# Patient Record
Sex: Male | Born: 1980 | Race: Black or African American | Hispanic: No | Marital: Married | Smoking: Current every day smoker
Health system: Southern US, Community
[De-identification: ages and names within clinical notes are randomized; demographics above are authoritative.]

---

## 2017-03-28 ENCOUNTER — Emergency Department (HOSPITAL_COMMUNITY): Payer: Self-pay

## 2017-03-28 ENCOUNTER — Encounter (HOSPITAL_COMMUNITY): Payer: Self-pay | Admitting: Emergency Medicine

## 2017-03-28 ENCOUNTER — Other Ambulatory Visit: Payer: Self-pay

## 2017-03-28 ENCOUNTER — Emergency Department (HOSPITAL_COMMUNITY)
Admission: EM | Admit: 2017-03-28 | Discharge: 2017-03-28 | Disposition: A | Discharge status: Home / Self Care | Payer: Self-pay | Attending: Emergency Medicine | Admitting: Emergency Medicine

## 2017-03-28 DIAGNOSIS — R05 Cough: Secondary | ICD-10-CM

## 2017-03-28 DIAGNOSIS — F17210 Nicotine dependence, cigarettes, uncomplicated: Secondary | ICD-10-CM | POA: Insufficient documentation

## 2017-03-28 DIAGNOSIS — J069 Acute upper respiratory infection, unspecified: Secondary | ICD-10-CM | POA: Insufficient documentation

## 2017-03-28 DIAGNOSIS — R059 Cough, unspecified: Secondary | ICD-10-CM

## 2017-03-28 DIAGNOSIS — B9789 Other viral agents as the cause of diseases classified elsewhere: Secondary | ICD-10-CM | POA: Insufficient documentation

## 2017-03-28 MED ORDER — BENZONATATE 100 MG PO CAPS: 100.0000 mg | ORAL_CAPSULE | Freq: Three times a day (TID) | ORAL | 0 refills | Status: AC | PRN

## 2017-03-28 MED ORDER — ALBUTEROL SULFATE HFA 108 (90 BASE) MCG/ACT IN AERS
2.0000 | INHALATION_SPRAY | RESPIRATORY_TRACT | Status: DC | PRN
  Administered 2017-03-28: 2 via RESPIRATORY_TRACT
  Filled 2017-03-28: qty 6.7

## 2017-03-28 NOTE — ED Triage Notes (Signed)
Pt c/o cough for several days with throat pain and irration. Pt also reports chest tightness and HA with coughing spells. Pt feels he did have a fever at home, +chills, denies n/v/d.

## 2017-03-28 NOTE — Discharge Instructions (Signed)
It was my pleasure taking care of you today!   Your symptoms are likely due to a viral upper respiratory infection. Fortunately, we did not see evidence of serious infection and can treat your symptoms. Flonase and mucinex for nasal congestion, tessalon as needed for cough. Alternate between Tylenol or ibuprofen as needed for body aches / fevers.   Rest, drink plenty of fluids to be sure you are staying hydrated.   Please follow up with your primary doctor for discussion of your diagnoses and further evaluation after today's visit if symptoms persist longer than 7 days; Return to the ER for high fevers, difficulty breathing or other concerning symptoms   To find a primary care or specialty doctor please call [phone] or [phone] to access "[facility] Find a Doctor Service."  You may also go on the [facility] website at [email or web address]  There are also multiple [facility] and [facility] practices throughout the [place] that are frequently accepting new patients. You may find a clinic that is close to your home and contact them.  [facility] - [address] [place] [phone]  [facility] in [place] (also locations in [place] and [place]) - [address] [place] [phone]  [facility] - [address] [place] [phone]

## 2017-03-28 NOTE — ED Notes (Signed)
Patient transported to X-ray 

## 2017-03-28 NOTE — ED Provider Notes (Signed)
[facility]-EMERGENCY DEPT Provider Note   CSN: [id number] Arrival date & time: 03/28/17  1911   By signing my name below, I, Jesus Henderson, attest that this documentation has been prepared under the direction and in the presence of [facility], PA-C. Electronically Signed: Freida Henderson, Scribe. 03/28/2017. 8:29 PM.   History   Chief Complaint Chief Complaint  Patient presents with  . Cough    The history is provided by the patient. No language interpreter was used.    HPI Comments:  Jesus Henderson is a 36 y.o. male who presents to the Emergency Department complaining of a frequent dry cough x 2 days. He reports associated tightness in his chest with coughing, nasal congestion, sore throat and generalized pounding HA. Associated wiHe has taken cough syrup and Tylenol with minimal relief No sick contacts at home. Pt is a current smoker.   History reviewed. No pertinent past medical history.  There are no active problems to display for this patient.   History reviewed. No pertinent surgical history.     Home Medications    Prior to Admission medications   Medication Sig Start Date End Date Taking? Authorizing Provider  benzonatate (TESSALON) 100 MG capsule Take 1 capsule (100 mg total) by mouth 3 (three) times daily as needed for cough. 03/28/17   Ward, Chase Picket, PA-C    Family History No family history on file.  Social History Social History  Substance Use Topics  . Smoking status: Current Every Day Smoker    Types: Cigarettes  . Smokeless tobacco: Never Used  . Alcohol use Yes     Comment: occ     Allergies   Patient has no known allergies.   Review of Systems Review of Systems  Constitutional: Negative for fever.  HENT: Positive for congestion and sore throat.   Respiratory: Positive for cough and chest tightness.   Neurological: Positive for headaches.     Physical Exam Updated Vital Signs BP 112/66   Pulse 79   Temp 98.4 F (36.9 C)   Resp 18    Ht 6' (1.829 m)   Wt 68 kg (150 lb)   SpO2 98%   BMI 20.34 kg/m   Physical Exam  Constitutional: He is oriented to person, place, and time. He appears well-developed and well-nourished. No distress.  HENT:  Head: Normocephalic and atraumatic.  OP with erythema, no exudates or tonsillar hypertrophy. + nasal congestion with mucosal edema. No focal areas of sinus tenderness.  Neck: Normal range of motion. Neck supple.  No meningeal signs.   Cardiovascular: Normal rate, regular rhythm and normal heart sounds.   No murmur heard. Pulmonary/Chest: Effort normal and breath sounds normal. No respiratory distress. He has no wheezes. He has no rales.  Lungs are clear to auscultation bilaterally - no w/r/r  Abdominal: Soft. He exhibits no distension. There is no tenderness.  Musculoskeletal: Normal range of motion.  Neurological: He is alert and oriented to person, place, and time.  Skin: Skin is warm and dry. He is not diaphoretic.  Nursing note and vitals reviewed.    ED Treatments / Results  DIAGNOSTIC STUDIES:  Oxygen Saturation is 96% on RA, normal by my interpretation.    COORDINATION OF CARE:  8:12 PM Discussed treatment plan with pt at bedside and pt agreed to plan.  Labs (all labs ordered are listed, but only abnormal results are displayed) Labs Reviewed - No data to display  EKG  EKG Interpretation None  Radiology Dg Chest 2 View  Result Date: 03/28/2017 CLINICAL DATA:  Cough EXAM: CHEST  2 VIEW COMPARISON:  None. FINDINGS: The heart size and mediastinal contours are within normal limits. Both lungs are clear. The visualized skeletal structures are unremarkable. IMPRESSION: No active cardiopulmonary disease. Electronically Signed   By: Deatra RobinsonKevin  Herman M.D.   On: 03/28/2017 21:06    Procedures Procedures (including critical care time)  Medications Ordered in ED Medications  albuterol (PROVENTIL HFA;VENTOLIN HFA) 108 (90 Base) MCG/ACT inhaler 2 puff (2 puffs  Inhalation Given 03/28/17 2110)     Initial Impression / Assessment and Plan / ED Course  I have reviewed the triage vital signs and the nursing notes.  Pertinent labs & imaging results that were available during my care of the patient were reviewed by me and considered in my medical decision making (see chart for details).     Jesus PileKevonta Henderson is a 36 y.o. male who presents to ED for cough, congestion.   On exam, patient is afebrile, non-toxic appearing with a clear lung exam. Mild rhinorrhea and OP with erythema but no exudates or tonsillar hypertrophy.  CXR negative.   Sxs today likely due to viral URI.Symptomatic home care instructions discussed. Rx for tessalon given. PCP follow up strongly encouraged if symptoms persist. Reasons to return to ER discussed. All questions answered.   Blood pressure 112/66, pulse 79, temperature 98.4 F (36.9 C), resp. rate 18, height 6' (1.829 m), weight 68 kg (150 lb), SpO2 98 %.    Final Clinical Impressions(s) / ED Diagnoses   Final diagnoses:  Cough  Viral URI with cough    New Prescriptions New Prescriptions   BENZONATATE (TESSALON) 100 MG CAPSULE    Take 1 capsule (100 mg total) by mouth 3 (three) times daily as needed for cough.   I personally performed the services described in this documentation, which was scribed in my presence. The recorded information has been reviewed and is accurate.     Ward, Chase PicketJaime Pilcher, PA-C 03/28/17 2149    Gerhard MunchLockwood, Robert, MD 03/29/17 1236

## 2021-12-17 ENCOUNTER — Encounter (HOSPITAL_COMMUNITY): Payer: Self-pay

## 2021-12-17 ENCOUNTER — Emergency Department (HOSPITAL_COMMUNITY)
Admission: EM | Admit: 2021-12-17 | Discharge: 2021-12-17 | Disposition: A | Discharge status: Home / Self Care | Payer: Self-pay | Attending: Student | Admitting: Student

## 2021-12-17 ENCOUNTER — Emergency Department (HOSPITAL_COMMUNITY): Payer: Self-pay

## 2021-12-17 DIAGNOSIS — S0990XA Unspecified injury of head, initial encounter: Secondary | ICD-10-CM

## 2021-12-17 DIAGNOSIS — F17210 Nicotine dependence, cigarettes, uncomplicated: Secondary | ICD-10-CM | POA: Insufficient documentation

## 2021-12-17 DIAGNOSIS — S0181XA Laceration without foreign body of other part of head, initial encounter: Secondary | ICD-10-CM | POA: Insufficient documentation

## 2021-12-17 DIAGNOSIS — Z23 Encounter for immunization: Secondary | ICD-10-CM | POA: Insufficient documentation

## 2021-12-17 MED ORDER — TETANUS-DIPHTH-ACELL PERTUSSIS 5-2.5-18.5 LF-MCG/0.5 IM SUSY
0.5000 mL | PREFILLED_SYRINGE | Freq: Once | INTRAMUSCULAR | Status: AC
  Administered 2021-12-17: 0.5 mL via INTRAMUSCULAR
  Filled 2021-12-17: qty 0.5

## 2021-12-17 MED ORDER — LIDOCAINE-EPINEPHRINE (PF) 2 %-1:200000 IJ SOLN
10.0000 mL | Freq: Once | INTRAMUSCULAR | Status: AC
  Administered 2021-12-17: 10 mL
  Filled 2021-12-17: qty 20

## 2021-12-17 MED ORDER — CEPHALEXIN 500 MG PO CAPS: 500.0000 mg | ORAL_CAPSULE | Freq: Four times a day (QID) | ORAL | 0 refills | Status: AC

## 2021-12-17 NOTE — Discharge Instructions (Signed)
Return for any problem. ? ?Your laceration was repaired with sutures that should absorb on their own.  There is no need to return for suture removal.  It is important to take the full course of antibiotics prescribed to help prevent infection.  You may follow-up with Dr. Nancy Fetter of ophthalmology to make sure that your facial wound heals correctly. ? ?Imaging obtained today showed evidence of a nasal bone fracture.  Follow-up with ENT is advised. ?

## 2021-12-17 NOTE — ED Notes (Signed)
RN reviewed discharge instructions w/ pt. Follow up, prescriptions, pain management and wound care reviewed, pt had no further questions. Pt ambulated to [facility] to wait for wife to pick him up. ?

## 2021-12-17 NOTE — ED Notes (Signed)
RN spoke w/ pt's wife, updated on plan of care ?
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
## 2021-12-17 NOTE — ED Notes (Signed)
Patient transported to CT 
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
